# Patient Record
Sex: Female | Born: 1945 | Race: White | Hispanic: No | Smoking: Current every day smoker
Health system: Southern US, Community
[De-identification: ages and names within clinical notes are randomized; demographics above are authoritative.]

## PROBLEM LIST (undated history)

## (undated) DIAGNOSIS — C801 Malignant (primary) neoplasm, unspecified: Secondary | ICD-10-CM

## (undated) DIAGNOSIS — I10 Essential (primary) hypertension: Secondary | ICD-10-CM

## (undated) DIAGNOSIS — E079 Disorder of thyroid, unspecified: Secondary | ICD-10-CM

## (undated) HISTORY — PX: TUBAL LIGATION: SHX77

## (undated) HISTORY — PX: FOOT SURGERY: SHX648

## (undated) HISTORY — PX: CHOLECYSTECTOMY: SHX55

## (undated) HISTORY — PX: KNEE SURGERY: SHX244

---

## 2010-11-04 ENCOUNTER — Encounter (HOSPITAL_COMMUNITY): Admission: RE | Admit: 2010-11-04 | Discharge: 2010-12-22 | Payer: Self-pay | Source: Home / Self Care

## 2011-12-05 ENCOUNTER — Emergency Department (HOSPITAL_BASED_OUTPATIENT_CLINIC_OR_DEPARTMENT_OTHER)
Admission: EM | Admit: 2011-12-05 | Discharge: 2011-12-05 | Disposition: A | Discharge status: Home / Self Care | Payer: Medicare Other | Attending: Emergency Medicine | Admitting: Emergency Medicine

## 2011-12-05 ENCOUNTER — Encounter (HOSPITAL_BASED_OUTPATIENT_CLINIC_OR_DEPARTMENT_OTHER): Payer: Self-pay | Admitting: *Deleted

## 2011-12-05 DIAGNOSIS — E079 Disorder of thyroid, unspecified: Secondary | ICD-10-CM | POA: Insufficient documentation

## 2011-12-05 DIAGNOSIS — W268XXA Contact with other sharp object(s), not elsewhere classified, initial encounter: Secondary | ICD-10-CM | POA: Insufficient documentation

## 2011-12-05 DIAGNOSIS — S91109A Unspecified open wound of unspecified toe(s) without damage to nail, initial encounter: Secondary | ICD-10-CM | POA: Insufficient documentation

## 2011-12-05 DIAGNOSIS — S91119A Laceration without foreign body of unspecified toe without damage to nail, initial encounter: Secondary | ICD-10-CM

## 2011-12-05 DIAGNOSIS — I10 Essential (primary) hypertension: Secondary | ICD-10-CM | POA: Insufficient documentation

## 2011-12-05 DIAGNOSIS — F17200 Nicotine dependence, unspecified, uncomplicated: Secondary | ICD-10-CM | POA: Insufficient documentation

## 2011-12-05 HISTORY — DX: Malignant (primary) neoplasm, unspecified: C80.1

## 2011-12-05 HISTORY — DX: Disorder of thyroid, unspecified: E07.9

## 2011-12-05 HISTORY — DX: Essential (primary) hypertension: I10

## 2011-12-05 MED ORDER — TETANUS-DIPHTH-ACELL PERTUSSIS 5-2.5-18.5 LF-MCG/0.5 IM SUSP
0.5000 mL | Freq: Once | INTRAMUSCULAR | Status: AC
  Administered 2011-12-05: 0.5 mL via INTRAMUSCULAR
  Filled 2011-12-05: qty 0.5

## 2011-12-05 NOTE — ED Provider Notes (Signed)
Medical screening examination/treatment/procedure(s) were performed by non-physician practitioner and as supervising physician I was immediately available for consultation/collaboration.   Takari Lundahl A Mansel Strother, MD 12/05/11 1904 

## 2011-12-05 NOTE — ED Provider Notes (Signed)
History     CSN: [id number]  Arrival date & time 12/05/11  1554   First MD Initiated Contact with Patient 12/05/11 1606      Chief Complaint  Patient presents with  . Laceration    (Consider location/radiation/quality/duration/timing/severity/associated sxs/prior treatment) HPI Comments: Pt states that she dropped a cup on her foot and has a cut the second toe and has been unable to stop bleeding  Patient is a 66 y.o. female presenting with skin laceration. The history is provided by the patient. No language interpreter was used.  Laceration  The incident occurred 1 to 2 hours ago. The laceration is located on the right foot. Size: .5cm. The laceration mechanism was a a blunt object. The pain is mild. The pain has been constant since onset. She reports no foreign bodies present. Her tetanus status is out of date.    Past Medical History  Diagnosis Date  . Hypertension   . Thyroid disease   . Cancer     Past Surgical History  Procedure Date  . Tubal ligation   . Foot surgery   . Knee surgery     History reviewed. No pertinent family history.  History  Substance Use Topics  . Smoking status: Current Everyday Smoker  . Smokeless tobacco: Not on file  . Alcohol Use:     OB History    Grav Para Term Preterm Abortions TAB SAB Ect Mult Living                  Review of Systems  All other systems reviewed and are negative.    Allergies  Review of patient's allergies indicates no known allergies.  Home Medications   Current Outpatient Rx  Name Route Sig Dispense Refill  . DIPHENHYDRAMINE HCL 25 MG PO TABS Oral Take 25 mg by mouth once as needed. For sleep    . DULOXETINE HCL 30 MG PO CPEP Oral Take 30 mg by mouth daily.    Marland Kitchen LEVOTHYROXINE SODIUM 137 MCG PO TABS Oral Take 137 mcg by mouth daily.    Marland Kitchen PRESCRIPTION MEDICATION Oral Take 1 tablet by mouth daily.      BP 148/88  Pulse 84  Temp(Src) 97.9 F (36.6 C) (Oral)  Resp 20  Ht 5\' 6"  (1.676 m)  Wt  182 lb (82.555 kg)  BMI 29.38 kg/m2  SpO2 100%  Physical Exam  Nursing note and vitals reviewed. Constitutional: She is oriented to person, place, and time. She appears well-developed and well-nourished.  Cardiovascular: Normal rate and regular rhythm.   Pulmonary/Chest: Effort normal and breath sounds normal.  Musculoskeletal: Normal range of motion.  Neurological: She is alert and oriented to person, place, and time.  Skin:       .5cm superficial laceration to he right great toe    ED Course  LACERATION REPAIR Performed by: Teressa Lower Authorized by: Teressa Lower Consent: Verbal consent obtained. Written consent not obtained. Risks and benefits: risks, benefits and alternatives were discussed Consent given by: patient Patient understanding: patient states understanding of the procedure being performed Patient identity confirmed: verbally with patient Time out: Immediately prior to procedure a "time out" was called to verify the correct patient, procedure, equipment, support staff and site/side marked as required. Body area: lower extremity Location details: right second toe Laceration length: 0.5 cm Foreign bodies: no foreign bodies Irrigation solution: saline Irrigation method: syringe Amount of cleaning: standard Skin closure: glue Approximation: close Approximation difficulty: simple Patient tolerance: Patient tolerated the procedure  well with no immediate complications.   (including critical care time)  Labs Reviewed - No data to display No results found.   1. Toe laceration       MDM  Tetanus updated:wound closed without any problem        Teressa Lower, NP 12/05/11 1637

## 2011-12-05 NOTE — ED Notes (Signed)
Pt has small superficial lac to right second toe. She was unable to control bleeding. OK now.

## 2013-04-28 DIAGNOSIS — K449 Diaphragmatic hernia without obstruction or gangrene: Secondary | ICD-10-CM | POA: Insufficient documentation

## 2013-04-28 DIAGNOSIS — M961 Postlaminectomy syndrome, not elsewhere classified: Secondary | ICD-10-CM | POA: Insufficient documentation

## 2013-04-28 DIAGNOSIS — D485 Neoplasm of uncertain behavior of skin: Secondary | ICD-10-CM | POA: Insufficient documentation

## 2013-04-28 DIAGNOSIS — M4310 Spondylolisthesis, site unspecified: Secondary | ICD-10-CM | POA: Insufficient documentation

## 2013-04-28 DIAGNOSIS — Z124 Encounter for screening for malignant neoplasm of cervix: Secondary | ICD-10-CM | POA: Insufficient documentation

## 2013-04-28 DIAGNOSIS — M654 Radial styloid tenosynovitis [de Quervain]: Secondary | ICD-10-CM | POA: Insufficient documentation

## 2013-04-28 DIAGNOSIS — Z23 Encounter for immunization: Secondary | ICD-10-CM | POA: Insufficient documentation

## 2013-04-28 DIAGNOSIS — Z780 Asymptomatic menopausal state: Secondary | ICD-10-CM | POA: Insufficient documentation

## 2013-04-28 DIAGNOSIS — G8929 Other chronic pain: Secondary | ICD-10-CM | POA: Insufficient documentation

## 2013-10-03 DIAGNOSIS — K219 Gastro-esophageal reflux disease without esophagitis: Secondary | ICD-10-CM | POA: Insufficient documentation

## 2014-01-16 DIAGNOSIS — E890 Postprocedural hypothyroidism: Secondary | ICD-10-CM | POA: Insufficient documentation

## 2014-01-16 DIAGNOSIS — G4700 Insomnia, unspecified: Secondary | ICD-10-CM | POA: Insufficient documentation

## 2014-01-16 DIAGNOSIS — E785 Hyperlipidemia, unspecified: Secondary | ICD-10-CM | POA: Insufficient documentation

## 2014-01-16 DIAGNOSIS — I10 Essential (primary) hypertension: Secondary | ICD-10-CM | POA: Insufficient documentation

## 2014-02-27 DIAGNOSIS — J309 Allergic rhinitis, unspecified: Secondary | ICD-10-CM | POA: Insufficient documentation

## 2014-03-14 DIAGNOSIS — F330 Major depressive disorder, recurrent, mild: Secondary | ICD-10-CM | POA: Insufficient documentation

## 2014-07-02 ENCOUNTER — Other Ambulatory Visit: Payer: Self-pay | Admitting: Internal Medicine

## 2014-07-02 DIAGNOSIS — M25551 Pain in right hip: Secondary | ICD-10-CM

## 2014-07-02 DIAGNOSIS — M25552 Pain in left hip: Principal | ICD-10-CM

## 2014-07-16 ENCOUNTER — Ambulatory Visit
Admission: RE | Admit: 2014-07-16 | Discharge: 2014-07-16 | Disposition: A | Discharge status: Home / Self Care | Payer: No Typology Code available for payment source | Source: Ambulatory Visit | Attending: Unknown Physician Specialty | Admitting: Unknown Physician Specialty

## 2014-07-16 ENCOUNTER — Other Ambulatory Visit: Payer: Self-pay | Admitting: Internal Medicine

## 2014-07-16 DIAGNOSIS — M25552 Pain in left hip: Principal | ICD-10-CM

## 2014-07-16 DIAGNOSIS — M25551 Pain in right hip: Secondary | ICD-10-CM

## 2014-07-17 DIAGNOSIS — M5416 Radiculopathy, lumbar region: Secondary | ICD-10-CM | POA: Insufficient documentation

## 2014-07-17 DIAGNOSIS — M47812 Spondylosis without myelopathy or radiculopathy, cervical region: Secondary | ICD-10-CM | POA: Insufficient documentation

## 2014-10-21 ENCOUNTER — Other Ambulatory Visit: Payer: Self-pay | Admitting: Internal Medicine

## 2014-10-21 DIAGNOSIS — M1611 Unilateral primary osteoarthritis, right hip: Secondary | ICD-10-CM

## 2014-10-21 DIAGNOSIS — M1612 Unilateral primary osteoarthritis, left hip: Secondary | ICD-10-CM

## 2014-11-04 ENCOUNTER — Other Ambulatory Visit: Payer: Self-pay

## 2015-11-08 ENCOUNTER — Emergency Department (HOSPITAL_BASED_OUTPATIENT_CLINIC_OR_DEPARTMENT_OTHER): Payer: Medicare Other

## 2015-11-08 ENCOUNTER — Encounter (HOSPITAL_BASED_OUTPATIENT_CLINIC_OR_DEPARTMENT_OTHER): Payer: Self-pay | Admitting: Emergency Medicine

## 2015-11-08 ENCOUNTER — Emergency Department (HOSPITAL_BASED_OUTPATIENT_CLINIC_OR_DEPARTMENT_OTHER)
Admission: EM | Admit: 2015-11-08 | Discharge: 2015-11-08 | Disposition: A | Discharge status: Home / Self Care | Payer: Medicare Other | Attending: Emergency Medicine | Admitting: Emergency Medicine

## 2015-11-08 DIAGNOSIS — Y92008 Other place in unspecified non-institutional (private) residence as the place of occurrence of the external cause: Secondary | ICD-10-CM | POA: Diagnosis not present

## 2015-11-08 DIAGNOSIS — Z79899 Other long term (current) drug therapy: Secondary | ICD-10-CM | POA: Insufficient documentation

## 2015-11-08 DIAGNOSIS — S6991XA Unspecified injury of right wrist, hand and finger(s), initial encounter: Secondary | ICD-10-CM | POA: Diagnosis present

## 2015-11-08 DIAGNOSIS — W1839XA Other fall on same level, initial encounter: Secondary | ICD-10-CM | POA: Diagnosis not present

## 2015-11-08 DIAGNOSIS — Y9389 Activity, other specified: Secondary | ICD-10-CM | POA: Insufficient documentation

## 2015-11-08 DIAGNOSIS — Z859 Personal history of malignant neoplasm, unspecified: Secondary | ICD-10-CM | POA: Diagnosis not present

## 2015-11-08 DIAGNOSIS — W19XXXA Unspecified fall, initial encounter: Secondary | ICD-10-CM

## 2015-11-08 DIAGNOSIS — F17200 Nicotine dependence, unspecified, uncomplicated: Secondary | ICD-10-CM | POA: Diagnosis not present

## 2015-11-08 DIAGNOSIS — Y998 Other external cause status: Secondary | ICD-10-CM | POA: Diagnosis not present

## 2015-11-08 DIAGNOSIS — M79641 Pain in right hand: Secondary | ICD-10-CM

## 2015-11-08 DIAGNOSIS — I10 Essential (primary) hypertension: Secondary | ICD-10-CM | POA: Insufficient documentation

## 2015-11-08 DIAGNOSIS — S5011XA Contusion of right forearm, initial encounter: Secondary | ICD-10-CM | POA: Diagnosis not present

## 2015-11-08 DIAGNOSIS — E079 Disorder of thyroid, unspecified: Secondary | ICD-10-CM | POA: Insufficient documentation

## 2015-11-08 MED ORDER — HYDROCODONE-ACETAMINOPHEN 5-325 MG PO TABS: 1.0000 | ORAL_TABLET | ORAL | Status: AC | PRN

## 2015-11-08 NOTE — ED Provider Notes (Signed)
CSN: [id number]     Arrival date & time 11/08/15  1323 History   First MD Initiated Contact with Patient 11/08/15 1358     Chief Complaint  Patient presents with  . Fall     (Consider location/radiation/quality/duration/timing/severity/associated sxs/prior Treatment) The history is provided by the patient and medical records.    69 year old female with history of hypertension, hypothyroidism, presenting to the ED after a fall. Patient states she fell over a pile of stones that were moved off her porch while decorating for Christmas and lost her footing causing her to fall forward. She attempted to brace her fall with her right arm.  No head injury or LOC.  Patient states she has continued to have pain and swelling from right forearm all the way through hand.  No numbness or weakness.  No prior right hand injuries or surgeries.  She has been taking aleve, prednisone (recent diagnosis of RA), and icing arm at home without significant relief.  Patient is right hand dominant.  No anticoagulants.  VSS.  Past Medical History  Diagnosis Date  . Hypertension   . Thyroid disease   . Cancer [facility])    Past Surgical History  Procedure Laterality Date  . Tubal ligation    . Foot surgery    . Knee surgery     History reviewed. No pertinent family history. Social History  Substance Use Topics  . Smoking status: Current Every Day Smoker  . Smokeless tobacco: None  . Alcohol Use: None   OB History    No data available     Review of Systems  Musculoskeletal: Positive for joint swelling and arthralgias.  All other systems reviewed and are negative.     Allergies  Review of patient's allergies indicates no known allergies.  Home Medications   Prior to Admission medications   Medication Sig Start Date End Date Taking? Authorizing Provider  diphenhydrAMINE (BENADRYL) 25 MG tablet Take 25 mg by mouth once as needed. For sleep    Historical Provider, MD  DULoxetine (CYMBALTA) 30 MG  capsule Take 30 mg by mouth daily.    Historical Provider, MD  levothyroxine (SYNTHROID, LEVOTHROID) 137 MCG tablet Take 137 mcg by mouth daily.    Historical Provider, MD  PRESCRIPTION MEDICATION Take 1 tablet by mouth daily.    Historical Provider, MD   BP 136/84 mmHg  Temp(Src) 97.7 F (36.5 C) (Oral)  Resp 18  Ht 5\' 5"  (1.651 m)  Wt 86.183 kg  BMI 31.62 kg/m2  SpO2 100%   Physical Exam  Constitutional: She is oriented to person, place, and time. She appears well-developed and well-nourished. No distress.  HENT:  Head: Normocephalic and atraumatic.  Mouth/Throat: Oropharynx is clear and moist.  No visible signs of head trauma  Eyes: Conjunctivae and EOM are normal. Pupils are equal, round, and reactive to light.  Neck: Normal range of motion. Neck supple.  Cardiovascular: Normal rate, regular rhythm and normal heart sounds.   Pulmonary/Chest: Effort normal and breath sounds normal. No respiratory distress. She has no wheezes.  Abdominal: Soft. Bowel sounds are normal.  Musculoskeletal: Normal range of motion.  Right shoulder WNL Hematoma noted to proximal right forearm; locally TTP Generalized swelling of right wrist and hand without bony deformity Strong radial pulse and cap refill; normal sensation throughout arm and hand; extremity is well perfused  Neurological: She is alert and oriented to person, place, and time.  Skin: Skin is warm and dry. She is not diaphoretic.  Psychiatric: She  has a normal mood and affect.  Nursing note and vitals reviewed.   ED Course  ORTHOPEDIC INJURY TREATMENT Date/Time: 11/08/2015 3:43 PM Performed by: Larene Pickett Authorized by: Larene Pickett Consent: Verbal consent obtained. Risks and benefits: risks, benefits and alternatives were discussed Consent given by: patient Patient understanding: patient states understanding of the procedure being performed Required items: required blood products, implants, devices, and special  equipment available Patient identity confirmed: verbally with patient Injury location: hand Location details: right hand Injury type: soft tissue Pre-procedure neurovascular assessment: neurovascularly intact Immobilization: brace and sling Supplies used: elastic bandage Post-procedure neurovascular assessment: post-procedure neurovascularly intact Patient tolerance: Patient tolerated the procedure well with no immediate complications   (including critical care time) Labs Review Labs Reviewed - No data to display  Imaging Review Dg Forearm Right  11/08/2015  CLINICAL DATA:  Fall 4 days ago with right forearm pain. Initial encounter. EXAM: RIGHT FOREARM - 2 VIEW COMPARISON:  None. FINDINGS: There is no evidence of fracture or other focal bone lesions. Soft tissues are unremarkable. IMPRESSION: Negative. Electronically Signed   By: Aletta Edouard M.D.   On: 11/08/2015 14:46   Dg Wrist Complete Right  11/08/2015  CLINICAL DATA:  Injury 4 days ago with right wrist pain. Initial encounter. EXAM: RIGHT WRIST - COMPLETE 3+ VIEW COMPARISON:  None. FINDINGS: No evidence of acute fracture or dislocation. Moderate degenerative changes are seen involving first and second CMC joints. No evidence bony lesion or destruction. Soft tissues are unremarkable. IMPRESSION: No acute fracture identified. Electronically Signed   By: Aletta Edouard M.D.   On: 11/08/2015 14:45   Dg Hand Complete Right  11/08/2015  CLINICAL DATA:  Tripped and fell over lawn deck duration. Forearm and wrist pain. EXAM: RIGHT HAND - COMPLETE 3+ VIEW COMPARISON:  None. FINDINGS: Advanced osteoarthritis is identified at the basilar joint. The bone mineralization appears normal. There is no acute fracture or dislocation. IMPRESSION: 1. No acute findings. 2. Advanced basilar joint osteoarthritis. Electronically Signed   By: Kerby Moors M.D.   On: 11/08/2015 14:47   I have personally reviewed and evaluated these images and lab  results as part of my medical decision-making.   EKG Interpretation None      MDM   Final diagnoses:  Fall, initial encounter  Traumatic hematoma of forearm, right, initial encounter  Right hand pain   69 year old female with fall earlier this week. No head injury or loss of consciousness. She has swelling of her right hand, wrist, and proximal forearm. No acute bony deformities noted. She is not currently on any type of anticoagulation. Right upper extremity remains neurovascularly intact, compartments soft and easily compressible. X-rays are negative for acute bony findings. Ace wrap was applied to right wrist and hand she was given a shoulder sling as well. Rx Vicodin for pain. She will follow-up with her PCP next week for recheck.  Discussed plan with patient, he/she acknowledged understanding and agreed with plan of care.  Return precautions given for new or worsening symptoms.    Larene Pickett, PA-C 11/08/15 1738  Leonard Schwartz, MD 11/09/15 [phone]

## 2015-11-08 NOTE — ED Notes (Signed)
Pt reports falling over decoration on wed while on porch, denies hitting head but is complaining of right hand pain

## 2015-11-08 NOTE — Discharge Instructions (Signed)
Take the prescribed medication as directed.  Use caution-- it can make you sleepy/drowsy. May continue to ice/elevate arm at home to help with swelling. Follow-up with your primary care physician. Return to the ED for new or worsening symptoms.

## 2015-12-11 ENCOUNTER — Telehealth: Payer: Self-pay | Admitting: *Deleted

## 2015-12-11 NOTE — Telephone Encounter (Signed)
Pharmacy called to verify Rx: HYDROcodone-acetaminophen (NORCO/VICODIN) 5-325 MG tablet since [place].  EDCM verified [facility] admission 11/08/15 and Rx written at that time.

## 2016-01-13 DIAGNOSIS — M159 Polyosteoarthritis, unspecified: Secondary | ICD-10-CM | POA: Insufficient documentation

## 2016-01-13 DIAGNOSIS — M80071A Age-related osteoporosis with current pathological fracture, right ankle and foot, initial encounter for fracture: Secondary | ICD-10-CM | POA: Insufficient documentation

## 2016-01-21 DIAGNOSIS — S62021K Displaced fracture of middle third of navicular [scaphoid] bone of right wrist, subsequent encounter for fracture with nonunion: Secondary | ICD-10-CM | POA: Insufficient documentation

## 2016-03-22 DIAGNOSIS — M9903 Segmental and somatic dysfunction of lumbar region: Secondary | ICD-10-CM | POA: Insufficient documentation

## 2016-04-08 DIAGNOSIS — F419 Anxiety disorder, unspecified: Secondary | ICD-10-CM | POA: Insufficient documentation

## 2016-04-08 DIAGNOSIS — Z634 Disappearance and death of family member: Secondary | ICD-10-CM | POA: Insufficient documentation

## 2016-04-08 DIAGNOSIS — F411 Generalized anxiety disorder: Secondary | ICD-10-CM | POA: Insufficient documentation

## 2016-05-19 DIAGNOSIS — Z85850 Personal history of malignant neoplasm of thyroid: Secondary | ICD-10-CM | POA: Insufficient documentation

## 2016-06-03 DIAGNOSIS — R928 Other abnormal and inconclusive findings on diagnostic imaging of breast: Secondary | ICD-10-CM | POA: Insufficient documentation

## 2016-06-07 DIAGNOSIS — R590 Localized enlarged lymph nodes: Secondary | ICD-10-CM | POA: Insufficient documentation

## 2016-07-08 DIAGNOSIS — Z969 Presence of functional implant, unspecified: Secondary | ICD-10-CM | POA: Insufficient documentation

## 2017-08-05 IMAGING — DX DG WRIST COMPLETE 3+V*R*
4 series · 4 of 4 positions shown · non-contrast
Comparison: None.

CLINICAL DATA: Injury 4 days ago with right wrist pain. Initial
encounter.

EXAM:
RIGHT WRIST - COMPLETE 3+ VIEW

[wrist pa]
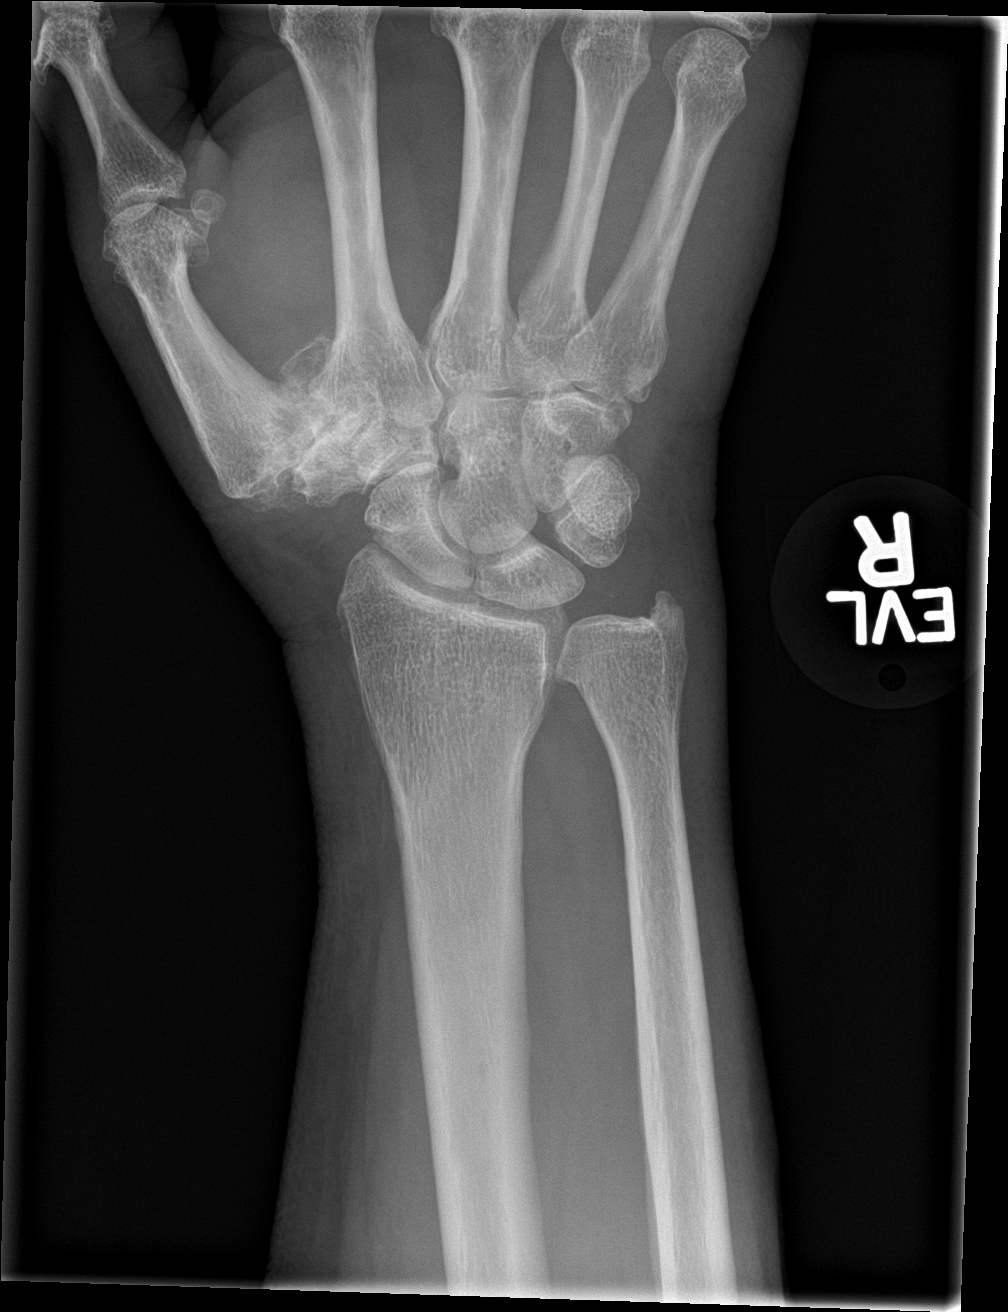

[wrist obl]
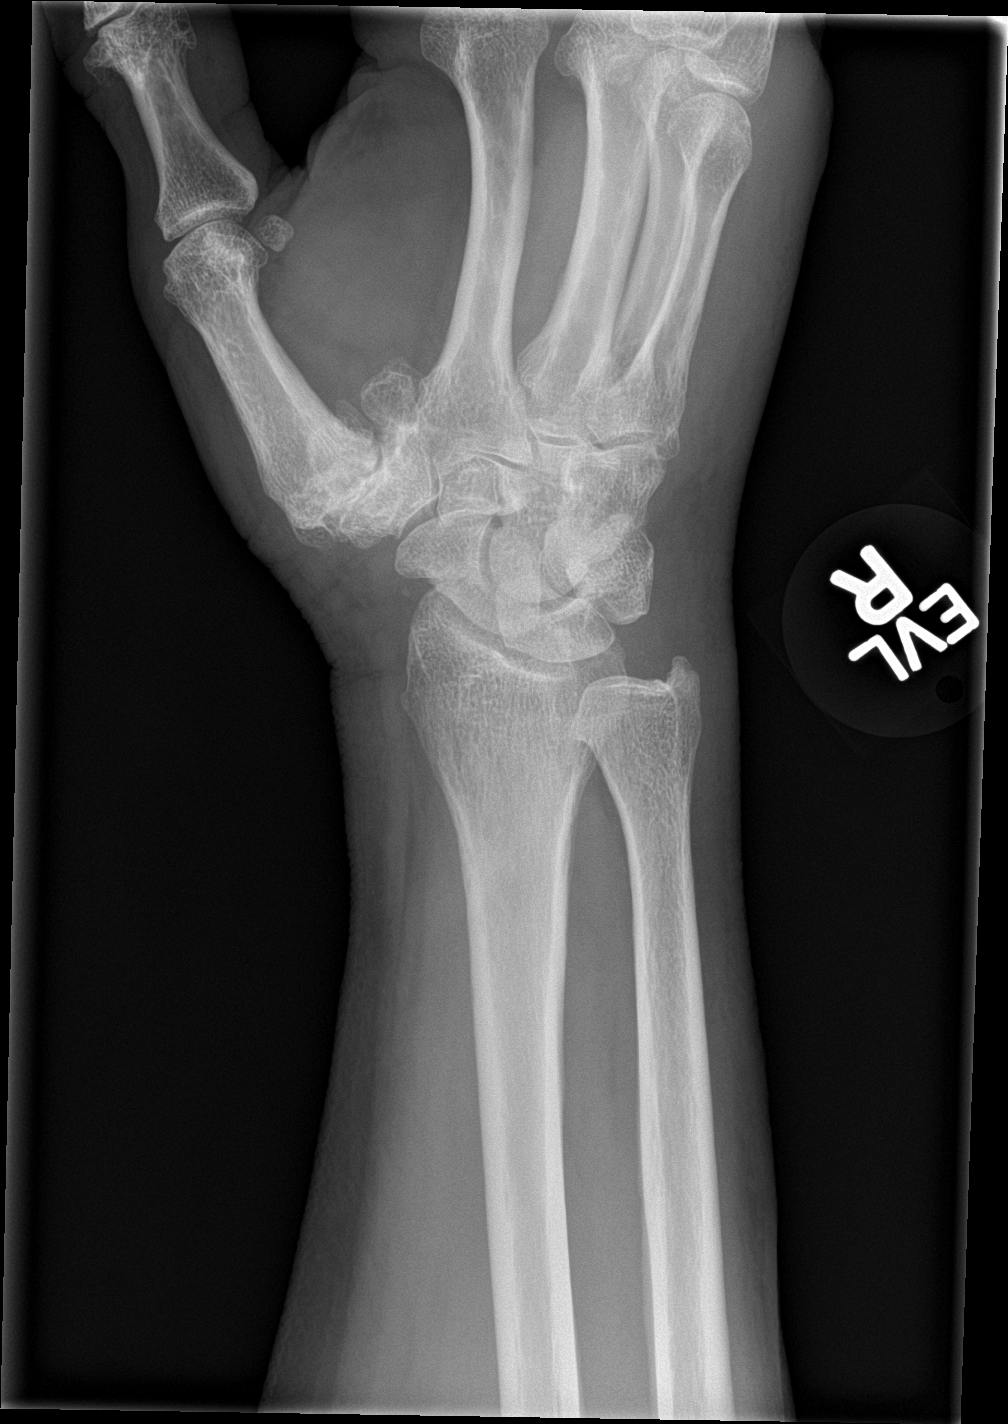

[wrist lat]
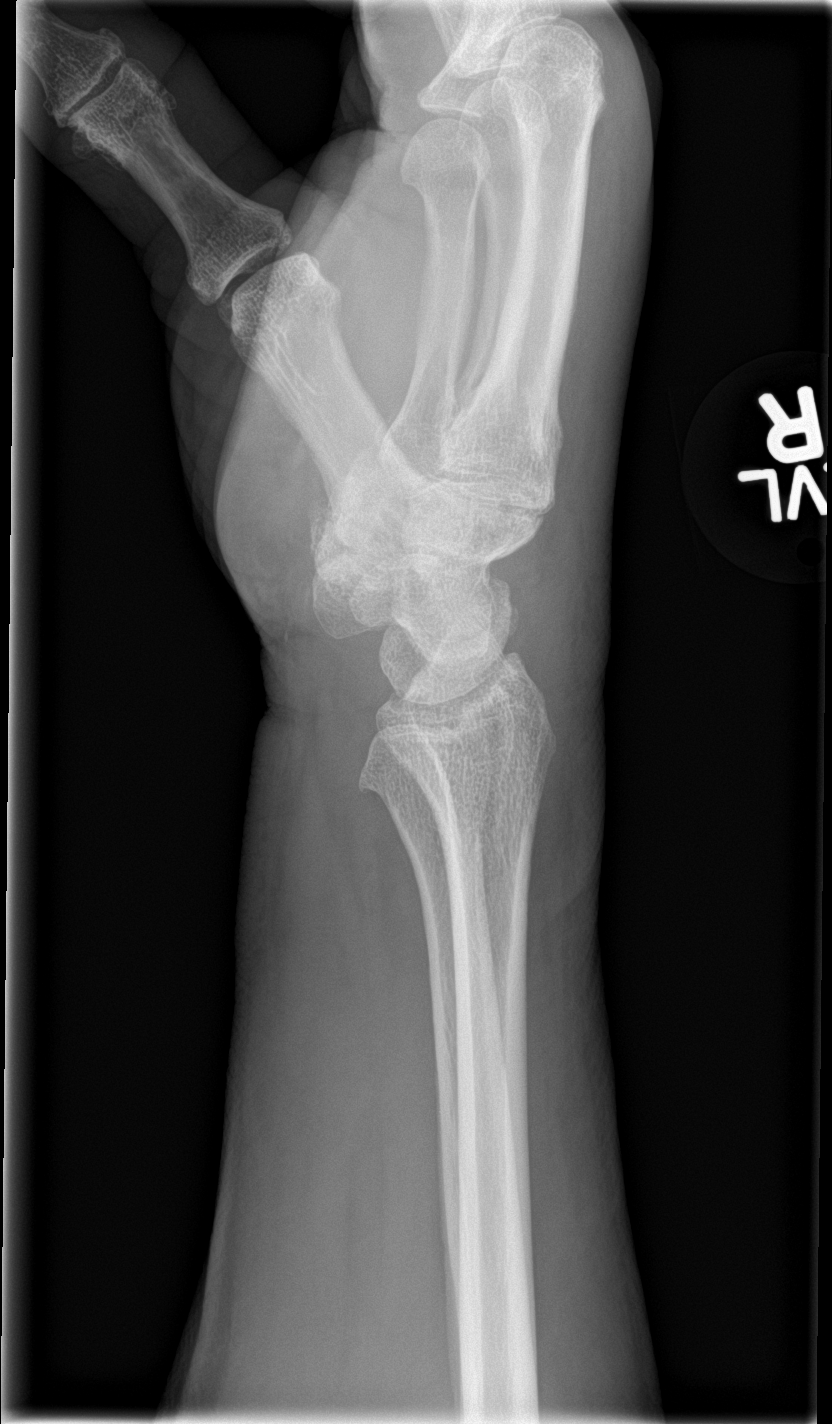

[wrist navicular]
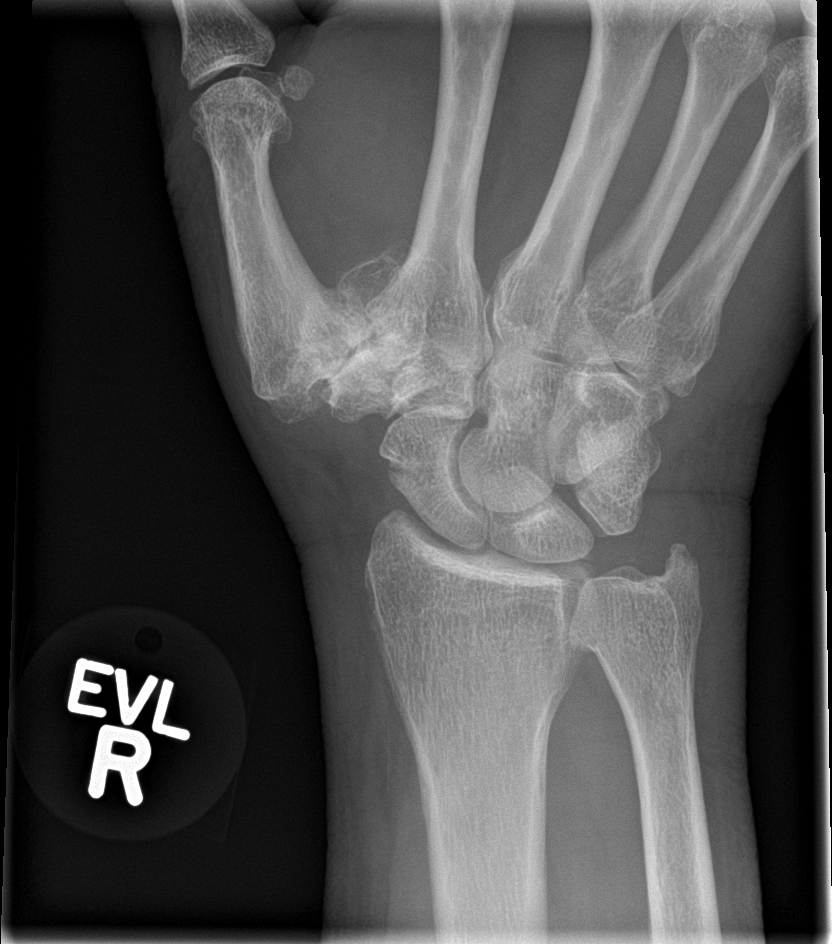

[4 of 4 positions shown; findings below may reference images not displayed]

FINDINGS: No evidence of acute fracture or dislocation. Moderate degenerative
changes are seen involving first and second CMC joints. No evidence
bony lesion or destruction. Soft tissues are unremarkable.
IMPRESSION: No acute fracture identified.

## 2017-11-04 DIAGNOSIS — M170 Bilateral primary osteoarthritis of knee: Secondary | ICD-10-CM | POA: Insufficient documentation

## 2018-04-12 DIAGNOSIS — E876 Hypokalemia: Secondary | ICD-10-CM | POA: Insufficient documentation

## 2018-04-13 DIAGNOSIS — M7582 Other shoulder lesions, left shoulder: Secondary | ICD-10-CM | POA: Insufficient documentation

## 2020-06-05 DIAGNOSIS — E039 Hypothyroidism, unspecified: Secondary | ICD-10-CM | POA: Insufficient documentation

## 2020-07-02 DIAGNOSIS — M1711 Unilateral primary osteoarthritis, right knee: Secondary | ICD-10-CM | POA: Insufficient documentation

## 2020-07-02 DIAGNOSIS — Z96652 Presence of left artificial knee joint: Secondary | ICD-10-CM | POA: Insufficient documentation

## 2021-02-11 DIAGNOSIS — K279 Peptic ulcer, site unspecified, unspecified as acute or chronic, without hemorrhage or perforation: Secondary | ICD-10-CM | POA: Insufficient documentation

## 2021-04-30 ENCOUNTER — Telehealth: Payer: Self-pay | Admitting: *Deleted

## 2021-04-30 NOTE — Telephone Encounter (Signed)
Patient is calling and said that his bunions have come back with a vengence,has not been seen,NP Please schedule an upcoming appointment.

## 2021-05-08 ENCOUNTER — Ambulatory Visit: Payer: Medicare Other | Admitting: Podiatry

## 2021-05-27 DIAGNOSIS — M47816 Spondylosis without myelopathy or radiculopathy, lumbar region: Secondary | ICD-10-CM | POA: Insufficient documentation

## 2022-01-07 DIAGNOSIS — Z87891 Personal history of nicotine dependence: Secondary | ICD-10-CM | POA: Insufficient documentation

## 2022-01-07 DIAGNOSIS — J449 Chronic obstructive pulmonary disease, unspecified: Secondary | ICD-10-CM | POA: Insufficient documentation

## 2022-02-17 DIAGNOSIS — Z860100 Personal history of colon polyps, unspecified: Secondary | ICD-10-CM | POA: Insufficient documentation

## 2022-02-17 DIAGNOSIS — Z8711 Personal history of peptic ulcer disease: Secondary | ICD-10-CM | POA: Insufficient documentation

## 2022-06-15 DIAGNOSIS — L0291 Cutaneous abscess, unspecified: Secondary | ICD-10-CM | POA: Insufficient documentation

## 2022-06-30 ENCOUNTER — Ambulatory Visit (INDEPENDENT_AMBULATORY_CARE_PROVIDER_SITE_OTHER): Payer: Medicare Other | Admitting: Podiatry

## 2022-06-30 ENCOUNTER — Ambulatory Visit (INDEPENDENT_AMBULATORY_CARE_PROVIDER_SITE_OTHER): Payer: Medicare Other

## 2022-06-30 DIAGNOSIS — M21611 Bunion of right foot: Secondary | ICD-10-CM

## 2022-06-30 DIAGNOSIS — M21619 Bunion of unspecified foot: Secondary | ICD-10-CM | POA: Diagnosis not present

## 2022-06-30 DIAGNOSIS — M2042 Other hammer toe(s) (acquired), left foot: Secondary | ICD-10-CM

## 2022-06-30 DIAGNOSIS — M2041 Other hammer toe(s) (acquired), right foot: Secondary | ICD-10-CM | POA: Diagnosis not present

## 2022-06-30 DIAGNOSIS — M21612 Bunion of left foot: Secondary | ICD-10-CM

## 2022-06-30 DIAGNOSIS — M19179 Post-traumatic osteoarthritis, unspecified ankle and foot: Secondary | ICD-10-CM | POA: Diagnosis not present

## 2022-06-30 NOTE — Progress Notes (Signed)
Subjective:  Patient ID: Crystal Fields, female    DOB: [birth year],  MRN: [id number]  Chief Complaint  Patient presents with   Bunions    Bilateral bunion previous surgeries     76 y.o. female presents with the above complaint.  Presents with bilateral bunion pain with multiple history of other procedures done in the past.  Patient states that it is painful to touch painful to walk on.  He is a diabetic with last A1c of5.9.  He wanted to get it evaluated and discuss treatment options.  He states that his sugars have been a little bit out of control recently and the A1c may have raised up a little bit.He has had multiple procedures done to both of his feet with a closing base wedge osteotomy on the left foot and a possible head osteotomy as well on the right side he has had bunion done as well now with underlying osteoarthritis as well as osteoarthritis of the second metatarsophalangeal joint.  He states painful well pes cavus 7 out of 10 he has not seen anyone else prior to seeing me.  He denies any other acute complaints.   Review of Systems: Negative except as noted in the HPI. Denies N/V/F/Ch.  Past Medical History:  Diagnosis Date   Cancer ([place])    Hypertension    Thyroid disease     Current Outpatient Medications:    albuterol (VENTOLIN HFA) 108 (90 Base) MCG/ACT inhaler, Inhale into the lungs., Disp: , Rfl:    benzonatate (TESSALON) 100 MG capsule, Take 1-2 capsules orally up to three times daily as needed for cough., Disp: , Rfl:    buPROPion (WELLBUTRIN XL) 300 MG 24 hr tablet, Take 1 tablet by mouth daily., Disp: , Rfl:    clonazePAM (KLONOPIN) 1 MG tablet, , Disp: , Rfl:    diclofenac Sodium (VOLTAREN) 1 % GEL, Apply 2 g twice daily as needed to affected area. Note: 2 grams is about the size of a nickel., Disp: , Rfl:    diphenhydrAMINE (BENADRYL) 25 MG tablet, Take 25 mg by mouth once as needed. For sleep, Disp: , Rfl:    DULoxetine (CYMBALTA) 30 MG capsule, Take 30 mg by  mouth daily., Disp: , Rfl:    escitalopram (LEXAPRO) 20 MG tablet, , Disp: , Rfl:    ezetimibe (ZETIA) 10 MG tablet, Take 1 tablet by mouth daily., Disp: , Rfl:    fluticasone-salmeterol (ADVAIR) 250-50 MCG/ACT AEPB, Inhale into the lungs., Disp: , Rfl:    HYDROcodone-acetaminophen (NORCO/VICODIN) 5-325 MG tablet, Take 1 tablet by mouth every 4 (four) hours as needed., Disp: 12 tablet, Rfl: 0   levothyroxine (SYNTHROID) 150 MCG tablet, , Disp: , Rfl:    levothyroxine (SYNTHROID, LEVOTHROID) 137 MCG tablet, Take 137 mcg by mouth daily., Disp: , Rfl:    losartan-hydrochlorothiazide (HYZAAR) 100-25 MG tablet, , Disp: , Rfl:    mirtazapine (REMERON) 15 MG tablet, , Disp: , Rfl:    omeprazole (PRILOSEC) 40 MG capsule, Take 1 capsule by mouth daily., Disp: , Rfl:    pantoprazole (PROTONIX) 40 MG tablet, Take by mouth., Disp: , Rfl:    Phentermine HCl 8 MG TABS, Take by mouth., Disp: , Rfl:    PRESCRIPTION MEDICATION, Take 1 tablet by mouth daily., Disp: , Rfl:    amLODipine (NORVASC) 5 MG tablet, Take 5 mg by mouth daily., Disp: , Rfl:    diphenhydrAMINE (BENADRYL) 25 mg capsule, Take by mouth., Disp: , Rfl:  doxycycline (VIBRAMYCIN) 100 MG capsule, Take 100 mg by mouth 2 (two) times daily., Disp: , Rfl:    furosemide (LASIX) 20 MG tablet, Take 20 mg by mouth daily., Disp: , Rfl:    sulfamethoxazole-trimethoprim (BACTRIM) 400-80 MG tablet, Take 2 tablets by mouth 2 (two) times daily., Disp: , Rfl:   Social History   Tobacco Use  Smoking Status Every Day  Smokeless Tobacco Not on file    Allergies  Allergen Reactions   Lisinopril     Other reaction(s): Cough (ALLERGY/intolerance) Other reaction(s): COUGH Other reaction(s): COUGH    Rosuvastatin     Other reaction(s): Myalgias (intolerance), Other (See Comments) Myalgia. Myalgia.    Codeine Itching    "makes my nose itch"   Oxycodone Itching   Pravastatin     Other reaction(s): Cramps (ALLERGY/intolerance), Other (See  Comments) Charlie Horse Charlie Horse    Objective:  There were no vitals filed for this visit. There is no height or weight on file to calculate BMI. Constitutional Well developed. Well nourished.  Vascular Dorsalis pedis pulses palpable bilaterally. Posterior tibial pulses palpable bilaterally. Capillary refill normal to all digits.  No cyanosis or clubbing noted. Pedal hair growth normal.  Neurologic Normal speech. Oriented to person, place, and time. Epicritic sensation to light touch grossly present bilaterally.  Dermatologic Nails well groomed and normal in appearance. No open wounds. No skin lesions.  Orthopedic: Pain on palpation to bilateral forefoot.  Pain with range of motion of the first metatarsophalangeal joint pain with range of motion of the second metatarsophalangeal joint.Hammertoe contractures noted 2 through 5 semiflexible in nature.  Previous hardware noted pain on palpation to the previous hardware on the left foot   Radiographs: 3 views of skeletally mature adult foot noted: There is increase in intermetatarsal angle bilaterally osteoarthritic changes noted to both first metatarsophalangeal joint osteoarthritic changes noted to the second MTPJ joint as well.  Flattening of the second metatarsal head noted.Previous hardware noted appears to be in good position alignment. Assessment:   1. Bunion   2. Hammertoe of left foot   3. 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  Bilateral severe bunion deformity noted with underlying osteoarthritis with underlying painful hardware to the left side -All questions and concerns were discussed with the patient in extensive detail. -I ultimately discussed with him that given that he is a high risk diabetic there is a risk of wound complication better associated with it.  I would obtain new A1c to  confirm as his recent sugars have been elevated per patient.  I discussed with him that he will benefit from surgical fusion of the joints as well as removal of previous hardware.  The left side tends to be more painful than the right side.  Patient agrees with the plan will think about all the procedures will work on her sugars and will get back to me when he is ready.  No follow-ups on file.

## 2022-11-22 HISTORY — PX: NECK SURGERY: SHX720

## 2023-07-08 ENCOUNTER — Other Ambulatory Visit: Payer: Self-pay

## 2023-07-08 ENCOUNTER — Encounter (HOSPITAL_BASED_OUTPATIENT_CLINIC_OR_DEPARTMENT_OTHER): Payer: Self-pay

## 2023-07-08 ENCOUNTER — Emergency Department (HOSPITAL_BASED_OUTPATIENT_CLINIC_OR_DEPARTMENT_OTHER)
Admission: EM | Admit: 2023-07-08 | Discharge: 2023-07-08 | Disposition: A | Discharge status: Home / Self Care | Payer: Medicare Other | Attending: Emergency Medicine | Admitting: Emergency Medicine

## 2023-07-08 ENCOUNTER — Emergency Department (HOSPITAL_BASED_OUTPATIENT_CLINIC_OR_DEPARTMENT_OTHER): Payer: Medicare Other

## 2023-07-08 ENCOUNTER — Emergency Department (HOSPITAL_BASED_OUTPATIENT_CLINIC_OR_DEPARTMENT_OTHER): Payer: 59

## 2023-07-08 DIAGNOSIS — R519 Headache, unspecified: Secondary | ICD-10-CM

## 2023-07-08 DIAGNOSIS — M25551 Pain in right hip: Secondary | ICD-10-CM | POA: Diagnosis not present

## 2023-07-08 DIAGNOSIS — I10 Essential (primary) hypertension: Secondary | ICD-10-CM | POA: Insufficient documentation

## 2023-07-08 DIAGNOSIS — Z79899 Other long term (current) drug therapy: Secondary | ICD-10-CM | POA: Diagnosis not present

## 2023-07-08 DIAGNOSIS — W19XXXA Unspecified fall, initial encounter: Secondary | ICD-10-CM

## 2023-07-08 DIAGNOSIS — W01190A Fall on same level from slipping, tripping and stumbling with subsequent striking against furniture, initial encounter: Secondary | ICD-10-CM | POA: Insufficient documentation

## 2023-07-08 NOTE — ED Notes (Signed)
Discharge instructions reviewed with patient. Patient questions answered and opportunity for education reviewed. Patient voices understanding of discharge instructions with no further questions. Patient ambulatory with steady gait to lobby.  

## 2023-07-08 NOTE — ED Notes (Signed)
Patient ambulated to bathroom and around nurse station. Very unsteady holding onto tech to walk. Pt assisted back to bed.

## 2023-07-08 NOTE — Discharge Instructions (Addendum)
It was a pleasure taking care of you today!   There were no concerning emergent findings today on your imaging studies. Follow up with your orthopedist regarding todays ED visit as scheduled on Tuesday, 07/12/23. Continue with your medications as prescribed. Return to the ED if you are experiencing increasing/worsening symptoms.

## 2023-07-08 NOTE — ED Provider Notes (Signed)
[place] [facility] AT [facility] Provider Note   CSN: [id number] Arrival date & time: 07/08/23  1423     History  Chief Complaint  Patient presents with   Head Injury   Fall    Crystal Fields is a 77 y.o. female with a PMHx of HTN who presents to the ED with concerns for mechanical fall onset 5 days. She reports that she was walking when her four pronged cane became caught on the carpet which called her to fall and hit her head on a wooden chair. Pt has pain with moving her eyes upward. Denies LOC, nausea, vomiting, vision changes. Denies thinners.   The history is provided by the patient. No language interpreter was used.       Home Medications Prior to Admission medications   Medication Sig Start Date End Date Taking? Authorizing Provider  albuterol (VENTOLIN HFA) 108 (90 Base) MCG/ACT inhaler Inhale into the lungs. 03/02/22   [provider]  amLODipine (NORVASC) 5 MG tablet Take 5 mg by mouth daily. 06/06/22   [provider]  benzonatate (TESSALON) 100 MG capsule Take 1-2 capsules orally up to three times daily as needed for cough. 03/25/22   [provider]  buPROPion (WELLBUTRIN XL) 300 MG 24 hr tablet Take 1 tablet by mouth daily. 05/19/22   [provider]  clonazePAM Scarlette Calico) 1 MG tablet  02/02/17   [provider]  diclofenac Sodium (VOLTAREN) 1 % GEL Apply 2 g twice daily as needed to affected area. Note: 2 grams is about the size of a nickel. 04/04/19   [provider]  diphenhydrAMINE (BENADRYL) 25 mg capsule Take by mouth.    [provider]  diphenhydrAMINE (BENADRYL) 25 MG tablet Take 25 mg by mouth once as needed. For sleep    [provider]  doxycycline (VIBRAMYCIN) 100 MG capsule Take 100 mg by mouth 2 (two) times daily. 03/22/22   [provider]  DULoxetine (CYMBALTA) 30 MG capsule Take 30 mg by mouth daily.    [provider]  escitalopram (LEXAPRO) 20  MG tablet  02/17/22   [provider]  ezetimibe (ZETIA) 10 MG tablet Take 1 tablet by mouth daily. 03/02/22   [provider]  fluticasone-salmeterol (ADVAIR) 250-50 MCG/ACT AEPB Inhale into the lungs. 05/18/22   [provider]  furosemide (LASIX) 20 MG tablet Take 20 mg by mouth daily. 05/18/22   [provider]  HYDROcodone-acetaminophen (NORCO/VICODIN) 5-325 MG tablet Take 1 tablet by mouth every 4 (four) hours as needed. 11/08/15   Garlon Hatchet, PA-C  levothyroxine (SYNTHROID) 150 MCG tablet  05/12/17   [provider]  levothyroxine (SYNTHROID, LEVOTHROID) 137 MCG tablet Take 137 mcg by mouth daily.    [provider]  losartan-hydrochlorothiazide Mauri Reading) 100-25 MG tablet  04/22/17   [provider]  mirtazapine (REMERON) 15 MG tablet  05/19/22   [provider]  omeprazole (PRILOSEC) 40 MG capsule Take 1 capsule by mouth daily. 01/21/17   [provider]  pantoprazole (PROTONIX) 40 MG tablet Take by mouth. 01/07/22 01/07/23  [provider]  Phentermine HCl 8 MG TABS Take by mouth. 04/27/22   [provider]  PRESCRIPTION MEDICATION Take 1 tablet by mouth daily.    [provider]  sulfamethoxazole-trimethoprim (BACTRIM) 400-80 MG tablet Take 2 tablets by mouth 2 (two) times daily. 06/15/22   [provider]      Allergies    Lisinopril, Rosuvastatin, Codeine,  Oxycodone, and Pravastatin    Review of Systems   Review of Systems  All other systems reviewed and are negative.   Physical Exam Updated Vital Signs BP (!) 140/118 (BP Location: Right Arm)   Pulse 92   Temp 98.1 F (36.7 C)   Resp 18   Ht 5\' 5"  (1.651 m)   Wt 86 kg   SpO2 92%   BMI 31.55 kg/m  Physical Exam Vitals and nursing note reviewed.  Constitutional:      General: She is not in acute distress.    Appearance: Normal appearance.  Eyes:     General: No scleral icterus.    Extraocular Movements:  Extraocular movements intact.     Comments: Mild TTP noted with EOM superiorly. Healing ecchymosis noted to left upper and lower eyelid. Mild TTP noted to lateral superior aspect of left upper eyelid. Sclera without erythema noted.   Cardiovascular:     Rate and Rhythm: Normal rate and regular rhythm.     Pulses: Normal pulses.     Heart sounds: Normal heart sounds.  Pulmonary:     Effort: Pulmonary effort is normal. No respiratory distress.     Breath sounds: Normal breath sounds.  Abdominal:     Palpations: Abdomen is soft. There is no mass.     Tenderness: There is no abdominal tenderness.  Musculoskeletal:        General: Normal range of motion.     Cervical back: Neck supple.     Comments: No spinal TTP. No TTP noted to musculature of back. Mild TTP noted to right upper trapezius. Mild TTP noted to right hip with FROM.   Skin:    General: Skin is warm and dry.     Findings: No rash.  Neurological:     Mental Status: She is alert.     Sensory: Sensation is intact.     Motor: Motor function is intact.  Psychiatric:        Behavior: Behavior normal.     ED Results / Procedures / Treatments   Labs (all labs ordered are listed, but only abnormal results are displayed) Labs Reviewed - No data to display  EKG None  Radiology CT Head Wo Contrast  Result Date: 07/08/2023 CLINICAL DATA:  Head trauma, minor (Age >= 65y); Facial trauma, blunt; Neck trauma (Age >= 65y). Fall with head strike. Bruising around right eye. Cervical spine fusion in June. EXAM: CT HEAD WITHOUT CONTRAST CT MAXILLOFACIAL WITHOUT CONTRAST CT CERVICAL SPINE WITHOUT CONTRAST TECHNIQUE: Multidetector CT imaging of the head, cervical spine, and maxillofacial structures were performed using the standard protocol without intravenous contrast. Multiplanar CT image reconstructions of the cervical spine and maxillofacial structures were also generated. RADIATION DOSE REDUCTION: This exam was performed according to the  departmental dose-optimization program which includes automated exposure control, adjustment of the mA and/or kV according to patient size and/or use of iterative reconstruction technique. COMPARISON:  Cervical spine radiographs 02/17/2023. FINDINGS: CT HEAD FINDINGS Brain: No acute intracranial hemorrhage. Gray-white differentiation is preserved. No hydrocephalus or extra-axial collection. No mass effect or midline shift. Vascular: No hyperdense vessel or unexpected calcification. Skull: No calvarial fracture or suspicious bone lesion. Skull base is unremarkable. Other: None. CT MAXILLOFACIAL FINDINGS Osseous: No fracture or mandibular dislocation. No destructive process. Orbits: Negative. No traumatic or inflammatory finding. Sinuses: Clear. Soft tissues: Unremarkable. CT CERVICAL SPINE FINDINGS Alignment: Unchanged 4 mm anterolisthesis of C4 on C5. No traumatic malalignment. Skull base and vertebrae: Postoperative changes of C3-C7  ACDF. Hardware is intact. No surrounding lucency or associated fracture. Soft tissues and spinal canal: No prevertebral fluid or swelling. No visible canal hematoma. Disc levels: High-grade spinal canal stenosis or neural foraminal narrowing. Right-greater-than-left facet arthropathy in the upper thoracic spine. Upper chest: No acute findings. Other: None. IMPRESSION: 1. No acute intracranial abnormality. 2. No acute facial fracture. 3. No acute fracture or traumatic malalignment of the cervical spine. Postoperative changes of C3-C7 ACDF without evidence of hardware complication. Electronically Signed   By: Orvan Falconer M.D.   On: 07/08/2023 17:48   CT Cervical Spine Wo Contrast  Result Date: 07/08/2023 CLINICAL DATA:  Head trauma, minor (Age >= 65y); Facial trauma, blunt; Neck trauma (Age >= 65y). Fall with head strike. Bruising around right eye. Cervical spine fusion in June. EXAM: CT HEAD WITHOUT CONTRAST CT MAXILLOFACIAL WITHOUT CONTRAST CT CERVICAL SPINE WITHOUT CONTRAST  TECHNIQUE: Multidetector CT imaging of the head, cervical spine, and maxillofacial structures were performed using the standard protocol without intravenous contrast. Multiplanar CT image reconstructions of the cervical spine and maxillofacial structures were also generated. RADIATION DOSE REDUCTION: This exam was performed according to the departmental dose-optimization program which includes automated exposure control, adjustment of the mA and/or kV according to patient size and/or use of iterative reconstruction technique. COMPARISON:  Cervical spine radiographs 02/17/2023. FINDINGS: CT HEAD FINDINGS Brain: No acute intracranial hemorrhage. Gray-white differentiation is preserved. No hydrocephalus or extra-axial collection. No mass effect or midline shift. Vascular: No hyperdense vessel or unexpected calcification. Skull: No calvarial fracture or suspicious bone lesion. Skull base is unremarkable. Other: None. CT MAXILLOFACIAL FINDINGS Osseous: No fracture or mandibular dislocation. No destructive process. Orbits: Negative. No traumatic or inflammatory finding. Sinuses: Clear. Soft tissues: Unremarkable. CT CERVICAL SPINE FINDINGS Alignment: Unchanged 4 mm anterolisthesis of C4 on C5. No traumatic malalignment. Skull base and vertebrae: Postoperative changes of C3-C7 ACDF. Hardware is intact. No surrounding lucency or associated fracture. Soft tissues and spinal canal: No prevertebral fluid or swelling. No visible canal hematoma. Disc levels: High-grade spinal canal stenosis or neural foraminal narrowing. Right-greater-than-left facet arthropathy in the upper thoracic spine. Upper chest: No acute findings. Other: None. IMPRESSION: 1. No acute intracranial abnormality. 2. No acute facial fracture. 3. No acute fracture or traumatic malalignment of the cervical spine. Postoperative changes of C3-C7 ACDF without evidence of hardware complication. Electronically Signed   By: Orvan Falconer M.D.   On: 07/08/2023 17:48    CT Maxillofacial WO CM  Result Date: 07/08/2023 CLINICAL DATA:  Head trauma, minor (Age >= 65y); Facial trauma, blunt; Neck trauma (Age >= 65y). Fall with head strike. Bruising around right eye. Cervical spine fusion in June. EXAM: CT HEAD WITHOUT CONTRAST CT MAXILLOFACIAL WITHOUT CONTRAST CT CERVICAL SPINE WITHOUT CONTRAST TECHNIQUE: Multidetector CT imaging of the head, cervical spine, and maxillofacial structures were performed using the standard protocol without intravenous contrast. Multiplanar CT image reconstructions of the cervical spine and maxillofacial structures were also generated. RADIATION DOSE REDUCTION: This exam was performed according to the departmental dose-optimization program which includes automated exposure control, adjustment of the mA and/or kV according to patient size and/or use of iterative reconstruction technique. COMPARISON:  Cervical spine radiographs 02/17/2023. FINDINGS: CT HEAD FINDINGS Brain: No acute intracranial hemorrhage. Gray-white differentiation is preserved. No hydrocephalus or extra-axial collection. No mass effect or midline shift. Vascular: No hyperdense vessel or unexpected calcification. Skull: No calvarial fracture or suspicious bone lesion. Skull base is unremarkable. Other: None. CT MAXILLOFACIAL FINDINGS Osseous: No fracture or mandibular dislocation. No destructive  process. Orbits: Negative. No traumatic or inflammatory finding. Sinuses: Clear. Soft tissues: Unremarkable. CT CERVICAL SPINE FINDINGS Alignment: Unchanged 4 mm anterolisthesis of C4 on C5. No traumatic malalignment. Skull base and vertebrae: Postoperative changes of C3-C7 ACDF. Hardware is intact. No surrounding lucency or associated fracture. Soft tissues and spinal canal: No prevertebral fluid or swelling. No visible canal hematoma. Disc levels: High-grade spinal canal stenosis or neural foraminal narrowing. Right-greater-than-left facet arthropathy in the upper thoracic spine. Upper chest:  No acute findings. Other: None. IMPRESSION: 1. No acute intracranial abnormality. 2. No acute facial fracture. 3. No acute fracture or traumatic malalignment of the cervical spine. Postoperative changes of C3-C7 ACDF without evidence of hardware complication. Electronically Signed   By: Orvan Falconer M.D.   On: 07/08/2023 17:48   DG Hip Unilat W or Wo Pelvis 2-3 Views Right  Result Date: 07/08/2023 CLINICAL DATA:  Fall EXAM: DG HIP (WITH OR WITHOUT PELVIS) 2-3V RIGHT COMPARISON:  None Available. FINDINGS: There is no evidence of hip fracture or dislocation. There are degenerative changes of the pubic symphysis. Soft tissues are within normal limits. IMPRESSION: No acute fracture or dislocation. Electronically Signed   By: Darliss Cheney M.D.   On: 07/08/2023 17:47    Procedures Procedures    Medications Ordered in ED Medications - No data to display  ED Course/ Medical Decision Making/ A&P Clinical Course as of 07/08/23 1829  Fri Jul 08, 2023  3752 77 year old ground-level fall not on thinners.  Cross sectional imaging without focal pathology. [CC]  [id number] Discussed with patient imaging findings. Discussed with patient discharge treatment plan. Answered all available questions. Pt appears safe for discharge at this time.  [SB]    Clinical Course User Index [CC] Glyn Ade, MD [SB] Glenys Snader A, PA-C                                 Medical Decision Making Amount and/or Complexity of Data Reviewed Radiology: ordered.   Pt with mechanical fall x 5 days. Pt afebrile. On exam, patient with no spinal TTP. No TTP noted to musculature of back. Mild TTP noted to right upper trapezius. Mild TTP noted to right hip with FROM. No acute cardiovascular, respiratory, or abdominal exam findings. Differential diagnosis includes fracture, dislocation, orbital blowout fracture, strain/spasm.    Imaging: I ordered imaging studies including CT head, CT maxillofacial, CT cervical, right hip  xray I independently visualized and interpreted imaging which showed: CT scans with  1. No acute intracranial abnormality.  2. No acute facial fracture.  3. No acute fracture or traumatic malalignment of the cervical  spine. Postoperative changes of C3-C7 ACDF without evidence of  hardware complication.   Right hip xray without acute findings.  I agree with the radiologist interpretation   Disposition: Presentation suspicious for likely musculoskeletal concern status post fall. Doubt fracture, dislocation or herniation. After consideration of the diagnostic results and the patients response to treatment, I feel that the patient would benefit from Discharge home.  Pt instructed to follow up with her care team regarding todays ED visit. Supportive care and strict return precautions discussed with patient tonight. Appears safe for discharge at this time. Follow up as indicated in discharge paperwork.   This chart was dictated using voice recognition software, Dragon. Despite the best efforts of this provider to proofread and correct errors, errors may still occur which can change documentation meaning.   Final Clinical Impression(s) /  ED Diagnoses Final diagnoses:  Fall, initial encounter  Right hip pain  Facial pain    Rx / DC Orders ED Discharge Orders     None         Hutch Rhett A, PA-C 07/08/23 1830    Glyn Ade, MD 07/08/23 2201

## 2023-07-08 NOTE — ED Triage Notes (Signed)
The patient fell Sunday and hit her head on a wooden chair. She denied LOC. She denied blood thinner. She has bruising around her right eye. She is having headache and back pain. She had a neck fusion in June.
